# Patient Record
Sex: Female | Born: 1980 | Race: White | Hispanic: No | Marital: Married | Smoking: Never smoker
Health system: Southern US, Community
[De-identification: ages and names within clinical notes are randomized; demographics above are authoritative.]

## PROBLEM LIST (undated history)

## (undated) DIAGNOSIS — Z8049 Family history of malignant neoplasm of other genital organs: Secondary | ICD-10-CM

## (undated) DIAGNOSIS — Z803 Family history of malignant neoplasm of breast: Secondary | ICD-10-CM

## (undated) HISTORY — DX: Family history of malignant neoplasm of other genital organs: Z80.49

## (undated) HISTORY — DX: Family history of malignant neoplasm of breast: Z80.3

---

## 2005-06-24 ENCOUNTER — Emergency Department (HOSPITAL_COMMUNITY): Admission: EM | Admit: 2005-06-24 | Discharge: 2005-06-24 | Payer: Self-pay | Admitting: Emergency Medicine

## 2006-03-30 ENCOUNTER — Other Ambulatory Visit: Admission: RE | Admit: 2006-03-30 | Discharge: 2006-03-30 | Payer: Self-pay | Admitting: Obstetrics and Gynecology

## 2008-05-15 ENCOUNTER — Emergency Department (HOSPITAL_BASED_OUTPATIENT_CLINIC_OR_DEPARTMENT_OTHER): Admission: EM | Admit: 2008-05-15 | Discharge: 2008-05-15 | Payer: Self-pay | Admitting: Emergency Medicine

## 2008-05-17 ENCOUNTER — Emergency Department (HOSPITAL_BASED_OUTPATIENT_CLINIC_OR_DEPARTMENT_OTHER): Admission: EM | Admit: 2008-05-17 | Discharge: 2008-05-17 | Payer: Self-pay | Admitting: Emergency Medicine

## 2011-03-22 ENCOUNTER — Inpatient Hospital Stay (HOSPITAL_COMMUNITY): Admission: AD | Admit: 2011-03-22 | Payer: Self-pay | Source: Home / Self Care | Admitting: Obstetrics and Gynecology

## 2017-07-27 ENCOUNTER — Other Ambulatory Visit: Payer: Self-pay | Admitting: Obstetrics and Gynecology

## 2017-07-27 DIAGNOSIS — R928 Other abnormal and inconclusive findings on diagnostic imaging of breast: Secondary | ICD-10-CM

## 2017-08-02 ENCOUNTER — Ambulatory Visit
Admission: RE | Admit: 2017-08-02 | Discharge: 2017-08-02 | Disposition: A | Discharge status: Home / Self Care | Payer: 59 | Source: Ambulatory Visit | Attending: Obstetrics and Gynecology | Admitting: Obstetrics and Gynecology

## 2017-08-02 DIAGNOSIS — R928 Other abnormal and inconclusive findings on diagnostic imaging of breast: Secondary | ICD-10-CM

## 2017-08-21 ENCOUNTER — Encounter: Payer: 59 | Admitting: Genetics

## 2017-09-29 ENCOUNTER — Encounter (HOSPITAL_COMMUNITY): Payer: Self-pay

## 2017-09-29 ENCOUNTER — Emergency Department (HOSPITAL_COMMUNITY)
Admission: EM | Admit: 2017-09-29 | Discharge: 2017-09-29 | Disposition: A | Discharge status: Home / Self Care | Payer: 59 | Attending: Emergency Medicine | Admitting: Emergency Medicine

## 2017-09-29 ENCOUNTER — Other Ambulatory Visit: Payer: Self-pay

## 2017-09-29 DIAGNOSIS — Y998 Other external cause status: Secondary | ICD-10-CM | POA: Diagnosis not present

## 2017-09-29 DIAGNOSIS — Y92410 Unspecified street and highway as the place of occurrence of the external cause: Secondary | ICD-10-CM | POA: Insufficient documentation

## 2017-09-29 DIAGNOSIS — S0340XA Sprain of jaw, unspecified side, initial encounter: Secondary | ICD-10-CM | POA: Diagnosis not present

## 2017-09-29 DIAGNOSIS — S0911XA Strain of muscle and tendon of head, initial encounter: Secondary | ICD-10-CM

## 2017-09-29 DIAGNOSIS — Y939 Activity, unspecified: Secondary | ICD-10-CM | POA: Insufficient documentation

## 2017-09-29 DIAGNOSIS — M545 Low back pain: Secondary | ICD-10-CM | POA: Diagnosis not present
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
## 2017-10-18 ENCOUNTER — Encounter: Payer: Self-pay | Admitting: Genetic Counselor

## 2017-10-18 ENCOUNTER — Other Ambulatory Visit: Payer: 59

## 2017-10-18 ENCOUNTER — Ambulatory Visit (HOSPITAL_BASED_OUTPATIENT_CLINIC_OR_DEPARTMENT_OTHER): Payer: 59 | Admitting: Genetic Counselor

## 2017-10-18 DIAGNOSIS — Z8049 Family history of malignant neoplasm of other genital organs: Secondary | ICD-10-CM | POA: Diagnosis not present

## 2017-10-18 DIAGNOSIS — Z315 Encounter for genetic counseling: Secondary | ICD-10-CM

## 2017-10-18 DIAGNOSIS — Z803 Family history of malignant neoplasm of breast: Secondary | ICD-10-CM | POA: Diagnosis not present

## 2017-10-18 NOTE — Progress Notes (Signed)
REFERRING PROVIDER: Arvella Nigh, MD [address] [place], [place] [address]  PRIMARY PROVIDER:  Arvella Nigh, MD  PRIMARY REASON FOR VISIT:  1. Family history of breast cancer   2. Family history of uterine cancer      HISTORY OF PRESENT ILLNESS:   Sally Bryant, a 36 y.o. female, was seen for a [facility] cancer genetics consultation at the request of Dr. Radene Knee due to a family history of cancer.  Sally Bryant presents to clinic today to discuss the possibility of a hereditary predisposition to cancer, genetic testing, and to further clarify her future cancer risks, as well as potential cancer risks for family members.  Sally Bryant is a 36 y.o. female with no personal history of cancer.  Her father was recently diagnosed with breast cancer.  He has had a double mastectomy and reportedly underwent genetic testing for BRCA1 and BRCA2 mutations, which was reportedly negative.  CANCER HISTORY:   No history exists.     HORMONAL RISK FACTORS:  Menarche was at age 44.  First live birth at age 27.  OCP use for approximately 9 years.  Ovaries intact: yes.  Hysterectomy: no.  Menopausal status: premenopausal.  HRT use: 0 years. Colonoscopy: no; not examined. Mammogram within the last year: yes. Number of breast biopsies: 0. Up to date with pelvic exams:  yes. Any excessive radiation exposure in the past:  no   Past Medical History:  Diagnosis Date  . Family history of breast cancer   . Family history of uterine cancer     No past surgical history on file.  Social History   Socioeconomic History  . Marital status: Married    Spouse name: Not on file  . Number of children: 3  . Years of education: Not on file  . Highest education level: Not on file  Social Needs  . Financial resource strain: Not on file  . Food insecurity - worry: Not on file  . Food insecurity - inability: Not on file  . Transportation needs - medical: Not on file  . Transportation needs - non-medical:  Not on file  Occupational History  . Not on file  Tobacco Use  . Smoking status: Never Smoker  . Smokeless tobacco: Never Used  Substance and Sexual Activity  . Alcohol use: No    Frequency: Never  . Drug use: No  . Sexual activity: Not on file  Other Topics Concern  . Not on file  Social History Narrative  . Not on file     FAMILY HISTORY:  We obtained a detailed, 4-generation family history.  Significant diagnoses are listed below: Family History  Problem Relation Age of Onset  . Breast cancer Father 85  . BRCA 1/2 Father        BRCA neg  . Heart attack Maternal Grandfather   . Uterine cancer Paternal Grandmother   . Multiple myeloma Paternal Grandfather     The patient has three children who are cancer free.  She has one sister and three brothers who have not had cancer.  Both parents are living.  The patient's mother is 59 and has not had cancer.  She has one sister who is cancer free.  Both maternal grandparents are deceased.  Her grandmother died in her sleep in her 63's and her grandfather died as a complications of heart attack.  The patient's father was diagnosed with breast cancer at age 28.  He had genetic testing and was negative for  BRCA mutations, but did not have a full panel.  He has one brother who was cancer free.  His mother had a benign breast biopsy when she was young.  She developed uterine cancer in her 15's.  The grandfather had multiple myeloma.  Patient's maternal ancestors are of [place] descent, and paternal ancestors are of Caucasian descent. There is no reported Ashkenazi Jewish ancestry. There is no known consanguinity.  GENETIC COUNSELING ASSESSMENT: MADA SADIK is a 36 y.o. female with a family history of female breast cancer which is somewhat suggestive of a hereditary cancer syndrome and predisposition to cancer. We, therefore, discussed and recommended the following at today's visit.   DISCUSSION: We discussed that about 5-10% of breast cancer  in women, and about 2/3 of breast cancer in men is hereditary with most cases due to BRCA mutations.  Other causes of female breast cancer can be due to [place] and CHEK2 mutations.  Based on the uterine cancer in her grandmother, there could be a small risk for mutations within PTEN, however, the uterine cancer in those cases tends to occur in women under 69.  We reviewed the characteristics, features and inheritance patterns of hereditary cancer syndromes. We also discussed genetic testing, including the appropriate family members to test, the process of testing, insurance coverage and turn-around-time for results. We discussed the implications of a negative, positive and/or variant of uncertain significant result. We recommended Sally Bryant pursue genetic testing for the common hereditary cancer gene panel. The Hereditary Gene Panel offered by [facility] includes sequencing and/or deletion duplication testing of the following 47 genes: APC, ATM, AXIN2, BARD1, BMPR1A, BRCA1, BRCA2, BRIP1, CDH1, CDK4, CDKN2A (p14ARF), CDKN2A (p16INK4a), CHEK2, CTNNA1, DICER1, EPCAM (Deletion/duplication testing only), GREM1 (promoter region deletion/duplication testing only), KIT, MEN1, MLH1, MSH2, MSH3, MSH6, MUTYH, NBN, NF1, NHTL1, PALB2, PDGFRA, PMS2, POLD1, POLE, PTEN, RAD50, RAD51C, RAD51D, SDHB, SDHC, SDHD, SMAD4, SMARCA4. STK11, TP53, TSC1, TSC2, and VHL.  The following genes were evaluated for sequence changes only: SDHA and HOXB13 c.251G>A variant only.   Based on Sally Bryant's family history of cancer, she meets medical criteria for genetic testing. Despite that she meets criteria, she may still have an out of pocket cost. We discussed that if her out of pocket cost for testing is over $100, the laboratory will call and confirm whether she wants to proceed with testing.  If the out of pocket cost of testing is less than $100 she will be billed by the genetic testing laboratory.   We discussed that some people do not want to  undergo genetic testing due to fear of genetic discrimination.  A federal law called the Genetic Information Non-Discrimination Act (GINA) of 2008 helps protect individuals against genetic discrimination based on their genetic test results.  It impacts both health insurance and employment.  With health insurance, it protects against increased premiums, being kicked off insurance or being forced to take a test in order to be insured.  For employment it protects against hiring, firing and promoting decisions based on genetic test results.  Health status due to a cancer diagnosis is not protected under GINA.   Based on the patient's personal and family history, statistical models (Tyrer Cusik)  and literature data were used to estimate her risk of developing breast cancer. These estimate her lifetime risk of developing breast cancer to be approximately 17%. This estimation does not take into account any genetic testing results.  The patient's lifetime breast cancer risk is a preliminary estimate based on  available information using one of several models endorsed by the [place] (ACS). The ACS recommends consideration of breast MRI screening as an adjunct to mammography for patients at high risk (defined as 20% or greater lifetime risk). A more detailed breast cancer risk assessment can be considered, if clinically indicated.     PLAN: Despite our recommendation, Sally Bryant did not wish to pursue genetic testing at today's visit. We understand this decision, and remain available to coordinate genetic testing at any time in the future. We; therefore, recommend Sally Bryant continue to follow the cancer screening guidelines given by her primary healthcare provider.  Based on Sally Bryant's family history, we recommended her father, who was diagnosed with breast cancer at age 65, have genetic counseling and testing with a full panel.  We did discuss that this could be complicated by the fact that he has had  BRCA testing this year, and many times insurance does not want to pay for more than one test. Sally Bryant will let us know if we can be of any assistance in coordinating genetic counseling and/or testing for this family member.   Lastly, we encouraged Sally Bryant to remain in contact with cancer genetics annually so that we can continuously update the family history and inform her of any changes in cancer genetics and testing that may be of benefit for this family.   Ms.  Bryant's questions were answered to her satisfaction today. Our contact information was provided should additional questions or concerns arise. Thank you for the referral and allowing [place] to share in the care of your patient.   Deone Omahoney P. Florene Glen, [place], [facility] Certified Genetic Counselor [email or web address] phone: [phone]  The patient was seen for a total of 90 minutes in face-to-face genetic counseling.  This patient was discussed with Drs. Magrinat, Lindi Adie and/or Burr Medico who agrees with the above.    _______________________________________________________________________ For Office Staff:  Number of people involved in session: 1 Was an Intern/ student involved with case: no

## 2017-10-18 NOTE — Progress Notes (Deleted)
REFERRING PROVIDER: Arvella Nigh, MD [address] [place], [place] [address]  PRIMARY PROVIDER:  Arvella Nigh, MD  PRIMARY REASON FOR VISIT:  1. Family history of breast cancer   2. Family history of uterine cancer      HISTORY OF PRESENT ILLNESS:   Sally Bryant, a 36 y.o. female, was seen for a [facility] cancer genetics consultation at the request of Dr. Radene Knee due to a family history of cancer.  Sally Bryant presents to clinic today to discuss the possibility of a hereditary predisposition to cancer, genetic testing, and to further clarify her future cancer risks, as well as potential cancer risks for family members.  Sally Bryant is a 36 y.o. female with no personal history of cancer.  Her father was recently diagnosed with breast cancer.  He has had a Bryant mastectomy and reportedly underwent genetic testing for BRCA1 and BRCA2 mutations, which was reportedly negative.  CANCER HISTORY:   No history exists.     HORMONAL RISK FACTORS:  Menarche was at age 45.  First live birth at age 39.  OCP use for approximately 9 years.  Ovaries intact: yes.  Hysterectomy: no.  Menopausal status: premenopausal.  HRT use: 0 years. Colonoscopy: no; not examined. Mammogram within the last year: yes. Number of breast biopsies: 0. Up to date with pelvic exams:  yes. Any excessive radiation exposure in the past:  no  Past Medical History:  Diagnosis Date  . Family history of breast cancer   . Family history of uterine cancer     No past surgical history on file.  Social History   Socioeconomic History  . Marital status: Married    Spouse name: Not on file  . Number of children: 3  . Years of education: Not on file  . Highest education level: Not on file  Social Needs  . Financial resource strain: Not on file  . Food insecurity - worry: Not on file  . Food insecurity - inability: Not on file  . Transportation needs - medical: Not on file  . Transportation needs - non-medical:  Not on file  Occupational History  . Not on file  Tobacco Use  . Smoking status: Never Smoker  . Smokeless tobacco: Never Used  Substance and Sexual Activity  . Alcohol use: No    Frequency: Never  . Drug use: No  . Sexual activity: Not on file  Other Topics Concern  . Not on file  Social History Narrative  . Not on file     FAMILY HISTORY:  We obtained a detailed, 4-generation family history.  Significant diagnoses are listed below: Family History  Problem Relation Age of Onset  . Breast cancer Father 55  . BRCA 1/2 Father        BRCA neg  . Heart attack Maternal Grandfather   . Uterine cancer Paternal Grandmother   . Breast cancer Paternal Grandmother   . Multiple myeloma Paternal Grandfather     The patient has three children who are cancer free.  She has one sister and three brothers who have not had cancer.  Both parents are living.  The patient's mother is 52 and has not had cancer.  She has one sister who is cancer free.  Both maternal grandparents are deceased.  Her grandmother died in her sleep in her 61s and her grandfather died as a complications of heart attaks Sally Bryant is {aware/unaware} of previous family history of genetic testing for hereditary cancer risks.  Patient's maternal ancestors are of *** descent, and paternal ancestors are of *** descent. There {IS NO:12509} reported Ashkenazi Jewish ancestry. There {IS NO:12509} known consanguinity.  GENETIC COUNSELING ASSESSMENT: Sally Bryant is a 36 y.o. female with a {PERSONAL HISTORY} which is somewhat suggestive of a {DISEASE} and predisposition to cancer. We, therefore, discussed and recommended the following at today's visit.   DISCUSSION: We reviewed the characteristics, features and inheritance patterns of hereditary cancer syndromes. We also discussed genetic testing, including the appropriate family members to test, the process of testing, insurance coverage and turn-around-time for results. We discussed the  implications of a negative, positive and/or variant of uncertain significant result. We recommended Sally Bryant pursue genetic testing for the *** gene panel.   Based on Sally Bryant's {Personal/family:20331} history of cancer, she meets medical criteria for genetic testing. Despite that she meets criteria, she may still have an out of pocket cost. We discussed that if her out of pocket cost for testing is over $100, the laboratory will call and confirm whether she wants to proceed with testing.  If the out of pocket cost of testing is less than $100 she will be billed by the genetic testing laboratory.   ***We reviewed the characteristics, features and inheritance patterns of hereditary cancer syndromes. We also discussed genetic testing, including the appropriate family members to test, the process of testing, insurance coverage and turn-around-time for results. We discussed the implications of a negative, positive and/or variant of uncertain significant result. In order to get genetic test results in a timely manner so that Sally Bryant can use these genetic test results for surgical decisions, we recommended Sally Bryant pursue genetic testing for the ***. If this test is negative, we then recommend Sally Bryant pursue reflex genetic testing to the *** gene panel.   Based on Sally Bryant. Briere's {Personal/family:20331} history of cancer, she meets medical criteria for genetic testing. Despite that she meets criteria, she may still have an out of pocket cost. We discussed that if her out of pocket cost for testing is over $100, the laboratory will call and confirm whether she wants to proceed with testing.  If the out of pocket cost of testing is less than $100 she will be billed by the genetic testing laboratory.   ***We discussed with Sally Bryant that the family history is not highly consistent with a familial hereditary cancer syndrome, and we feel she is at low risk to harbor a gene mutation associated with such a condition. Thus,  we did not recommend any genetic testing, at this time, and recommended Sally Bryant continue to follow the cancer screening guidelines given by her primary healthcare provider.  ***In order to estimate her chance of having a {CA GENE:62345} mutation, we used statistical models ({GENMODELS:62370}) and laboratory data that take into account her personal medical history, family history and ancestry.  Because each model is different, there can be a lot of variability in the risks they give.  Therefore, these numbers must be considered a rough range and not a precise risk of having a {CA GENE:62345} mutation.  These models estimate that she has approximately a ***-***% chance of having a mutation. Based on this assessment of her family and personal history, genetic testing {IS/ISNOT:34056} recommended.  ***Based on the patient's personal and family history, statistical models ({GENMODELS:62370})  and literature data were used to estimate her risk of developing {CA HX:54794}. These estimate her lifetime risk of developing {CA HX:54794} to be approximately ***% to ***%.  This estimation does not take into account any genetic testing results.  The patient's lifetime breast cancer risk is a preliminary estimate based on available information using one of several models endorsed by the [place] (ACS). The ACS recommends consideration of breast MRI screening as an adjunct to mammography for patients at high risk (defined as 20% or greater lifetime risk). A more detailed breast cancer risk assessment can be considered, if clinically indicated.   ***Sally Bryant has been determined to be at high risk for breast cancer.  Therefore, we recommend that annual screening with mammography and breast MRI begin at age 50, or 10 years prior to the age of breast cancer diagnosis in a relative (whichever is earlier).  We discussed that Sally Bryant should discuss her individual situation with her referring physician and determine a  breast cancer screening plan with which they are both comfortable.       PLAN: After considering the risks, benefits, and limitations, Sally Bryant  provided informed consent to pursue genetic testing and the blood sample was sent to {Lab} Laboratories for analysis of the {test}. Results should be available within approximately {TAT TIME} weeks' time, at which point they will be disclosed by telephone to Sally Bryant, as will any additional recommendations warranted by these results. Sally Bryant will receive a summary of her genetic counseling visit and a copy of her results once available. This information will also be available in Epic. We encouraged Sally Bryant to remain in contact with cancer genetics annually so that we can continuously update the family history and inform her of any changes in cancer genetics and testing that may be of benefit for her family. Sally Bryant's questions were answered to her satisfaction today. Our contact information was provided should additional questions or concerns arise.  *** Despite our recommendation, Sally Bryant. Searson did not wish to pursue genetic testing at today's visit. We understand this decision, and remain available to coordinate genetic testing at any time in the future. We; therefore, recommend Sally Bryant. Zafar continue to follow the cancer screening guidelines given by her primary healthcare provider.  ***Based on Sally Bryant. Boehning's family history, we recommended her ***, who was diagnosed with *** at age ***, have genetic counseling and testing. Sally Bryant. Scholten will let us know if we can be of any assistance in coordinating genetic counseling and/or testing for this family member.   Lastly, we encouraged Sally Bryant. Grayson to remain in contact with cancer genetics annually so that we can continuously update the family history and inform her of any changes in cancer genetics and testing that may be of benefit for this family.   Sally Bryant.  Dano's questions were answered to her satisfaction today. Our contact  information was provided should additional questions or concerns arise. Thank you for the referral and allowing [place] to share in the care of your patient.   Karen P. Florene Glen, [employer], [facility] Certified Genetic Counselor [email or web address] phone: [phone]  The patient was seen for a total of *** minutes in face-to-face genetic counseling.  This patient was discussed with Drs. Magrinat, Lindi Adie and/or Burr Medico who agrees with the above.    _______________________________________________________________________ For Office Staff:  Number of people involved in session: *** Was an Intern/ student involved with case: {YES/NO:63}

## 2017-10-20 NOTE — Progress Notes (Signed)
The patient sent a copy of her dad's test result.  He was tested for the [facility] STAT panel.  The STAT Breast cancer panel offered by [facility] includes sequencing and rearrangement analysis for the following 9 genes:  ATM, BRCA1, BRCA2, CDH1, CHEK2, PALB2, PTEN, STK11 and TP53.   This panel tests for the genes we would be most concerned about based on the patient's father's diagnosis of female breast cancer.  She also updated her family history.  She reported that her grandmother had a breast biopsy when her father was 33, that was benign.  Therefore she never did have breast cancer.  She was diagnosed with uterine cancer in February 23, 1993 and died in 57.  She would have been in her 32's, Ms. Mcallister thinks, when she was diagnosed and died.  We discussed that based on her father's genetic testing, we would not need to perform further testing on her due to her dad having breast cancer.  The genes that were tested do not exclude all hereditary uterine cancer syndromes, although based on the age of her grandmother and lack of other cancers that would suggest a syndrome like Lynch syndrome, she would not meet criteria for additional testing.  If the patient wants additional testing we could send testing out, and her out of pocket cost would be $250.  The patient will let us know what she decided.  Father's genetic test result:

## 2018-04-22 IMAGING — MG 2D DIGITAL DIAGNOSTIC UNILATERAL LEFT MAMMOGRAM WITH CAD AND ADJ
6 series · 6 of 14 positions shown · non-contrast
Comparison: Baseline screening mammogram dated 07/26/2017.

CLINICAL DATA: Patient returns today to evaluate a possible left
breast mass identified on recent baseline screening mammogram.

Family history of breast cancer (father).
EXAM:
2D DIGITAL DIAGNOSTIC LEFT MAMMOGRAM WITH CAD AND ADJUNCT TOMO
ULTRASOUND LEFT BREAST

[L XCCL]
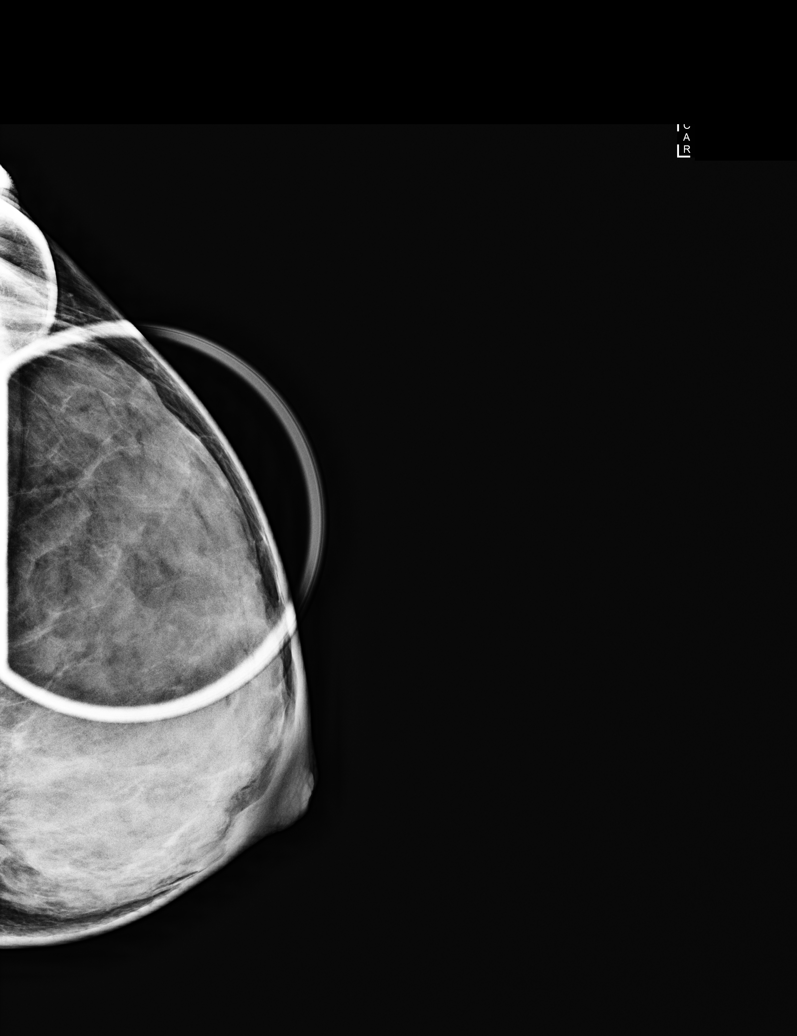

[L XCCL synth-2D]
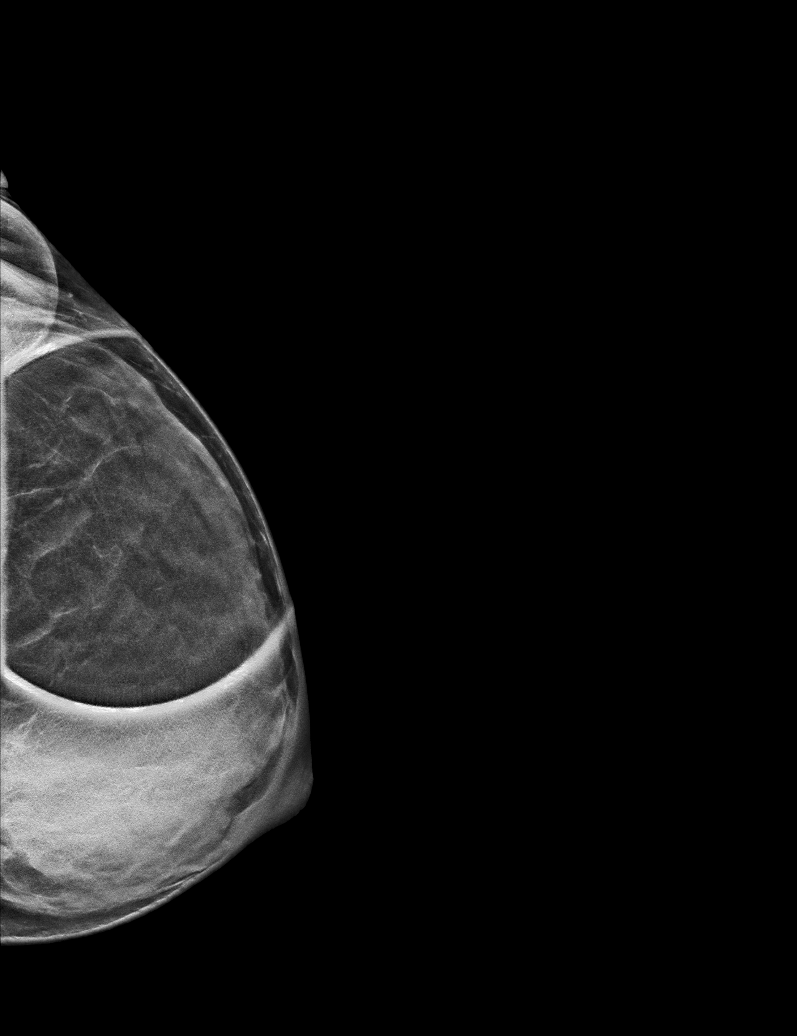

[L MLO synth-2D]
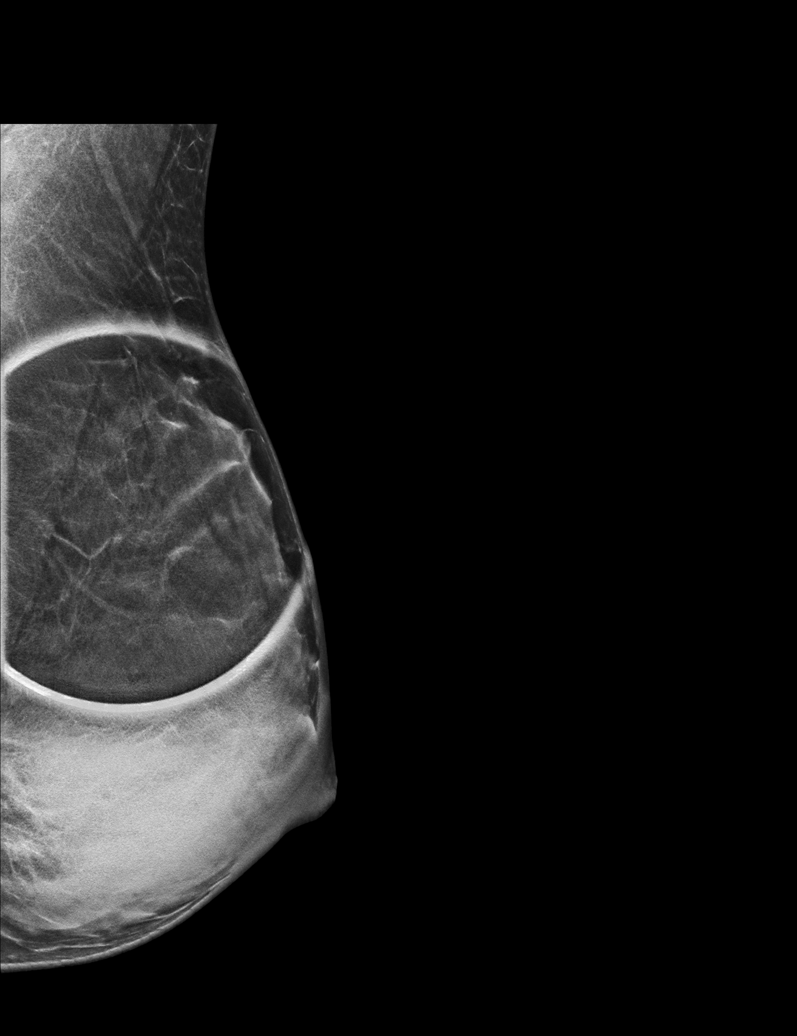

[L MLO]
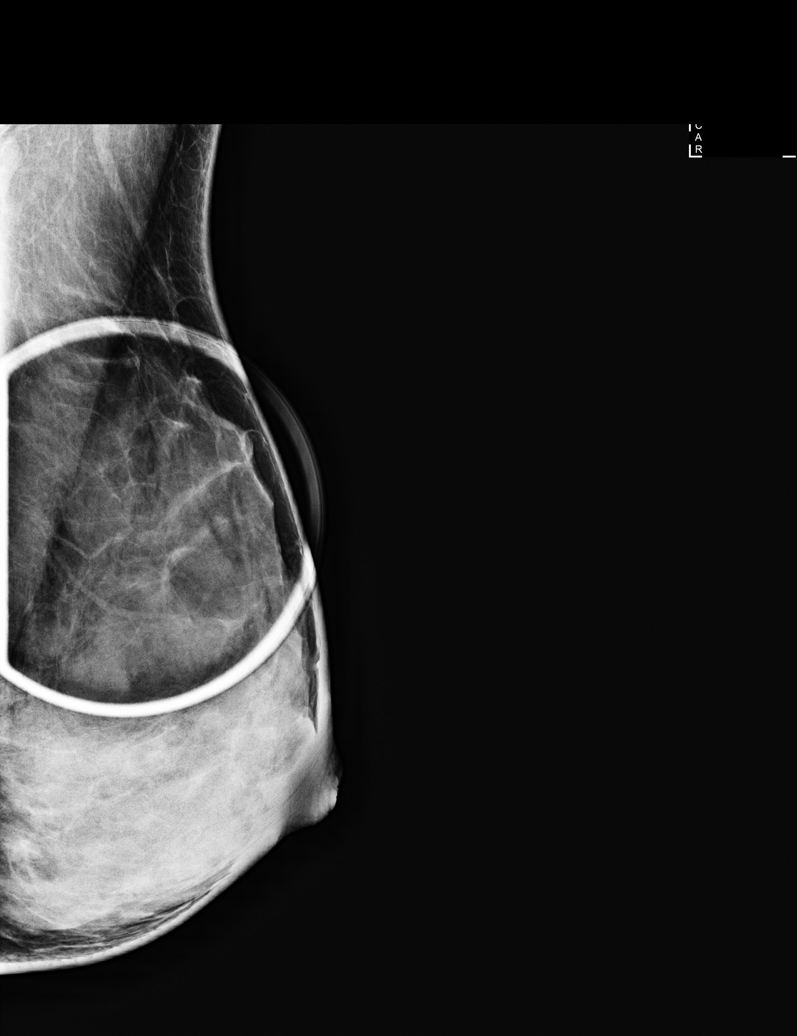

[L XCCL tomo · tomo slice 21/40.0]
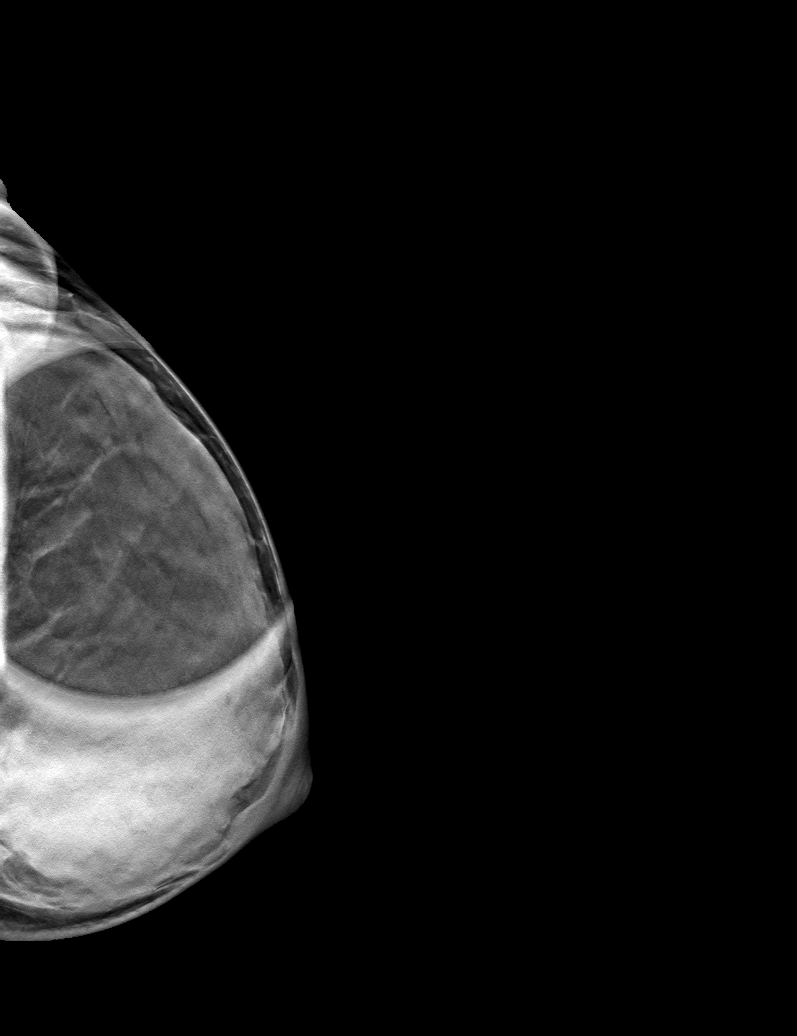

[L MLO tomo · tomo slice 19/38.0]
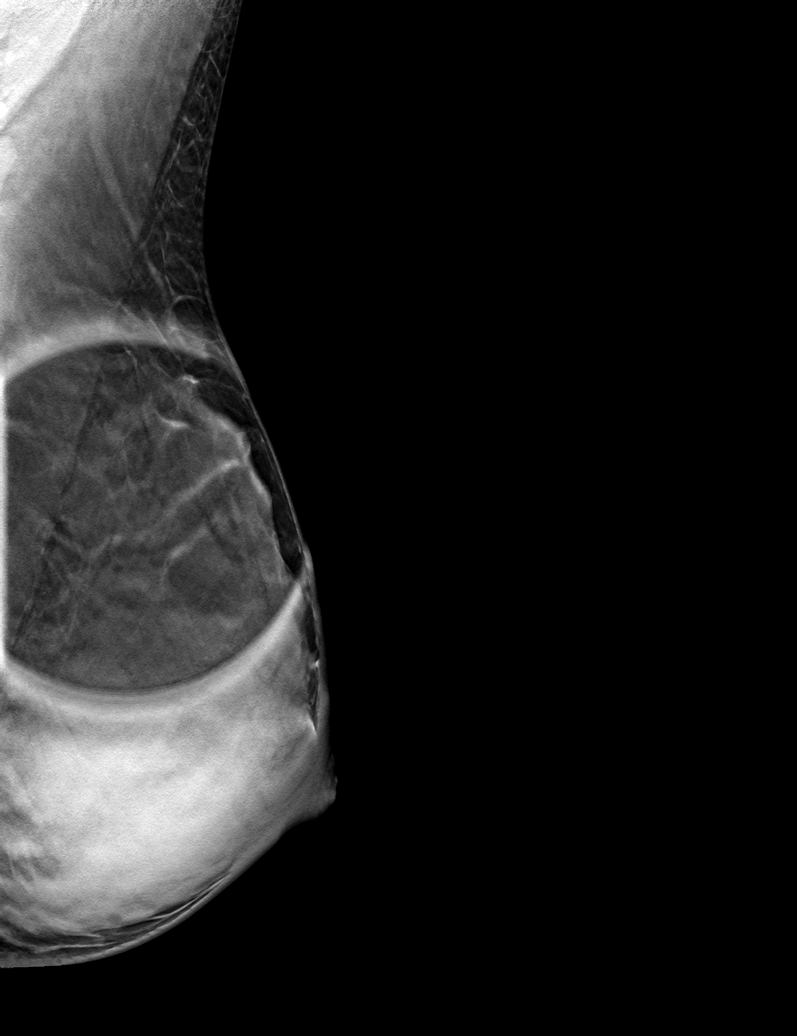

[6 of 14 positions shown; findings below may reference images not displayed]

ACR Breast Density Category d: The breast tissue is extremely dense,
which lowers the sensitivity of mammography.
FINDINGS: On today's additional views with spot compression and 3D
tomosynthesis, there is no persistent abnormality within the
upper-outer quadrant of the left breast indicating superimposition
of normal dense fibroglandular tissues. As the breasts are extremely
dense, ultrasound will be performed of this area to confirm
benignity.

Mammographic images were processed with CAD.

Targeted ultrasound is performed, evaluating the entire upper left
breast with particular attention to the upper outer quadrant
corresponding to the area of screening mammogram finding, showing
only normal fibroglandular tissues and fat lobules throughout. There
are multiple ridges of normal dense fibroglandular tissue within the
upper-outer quadrant of the left breast, a likely correlate for the
screening mammogram finding. No suspicious solid or cystic mass is
identified.
IMPRESSION: No evidence of malignancy within the left breast. Patient may return
to routine annual bilateral screening mammogram schedule.

RECOMMENDATION:
1.  Screening mammogram in one year.(Code:[id number])

2. Given patient's family history of breast cancer, would consider
starting a routine annual screening mammogram schedule at this time.
Given patient's family history of breast cancer and patient's
extremely dense breasts, would also consider the addition of annual
screening breast MRI to annual screening mammography. Per American
Cancer Society guidelines, annual screening MRI of the breasts is
recommended if a risk assessment calculation for breast cancer,
preferably using the Tyrer-Cuzick model, measures greater than 20%.

I have discussed the findings and recommendations with the patient.
Results were also provided in writing at the conclusion of the
visit. If applicable, a reminder letter will be sent to the patient
regarding the next appointment.

BI-RADS CATEGORY  1: Negative.
